# Patient Record
Sex: Female | Born: 1985 | Race: Black or African American | Hispanic: No | Marital: Married | Smoking: Never smoker
Health system: Southern US, Community
[De-identification: ages and names within clinical notes are randomized; demographics above are authoritative.]

---

## 2021-01-08 ENCOUNTER — Encounter (HOSPITAL_COMMUNITY): Payer: Self-pay | Admitting: Emergency Medicine

## 2021-01-08 ENCOUNTER — Emergency Department (HOSPITAL_COMMUNITY): Payer: No Typology Code available for payment source

## 2021-01-08 ENCOUNTER — Emergency Department (HOSPITAL_COMMUNITY)
Admission: EM | Admit: 2021-01-08 | Discharge: 2021-01-08 | Disposition: A | Discharge status: Home / Self Care | Payer: No Typology Code available for payment source | Attending: Emergency Medicine | Admitting: Emergency Medicine

## 2021-01-08 ENCOUNTER — Other Ambulatory Visit: Payer: Self-pay

## 2021-01-08 DIAGNOSIS — Z5321 Procedure and treatment not carried out due to patient leaving prior to being seen by health care provider: Secondary | ICD-10-CM | POA: Diagnosis not present

## 2021-01-08 DIAGNOSIS — Y92410 Unspecified street and highway as the place of occurrence of the external cause: Secondary | ICD-10-CM | POA: Diagnosis not present

## 2021-01-08 DIAGNOSIS — R519 Headache, unspecified: Secondary | ICD-10-CM | POA: Insufficient documentation

## 2021-01-08 DIAGNOSIS — M549 Dorsalgia, unspecified: Secondary | ICD-10-CM | POA: Diagnosis not present

## 2021-01-08 DIAGNOSIS — M25512 Pain in left shoulder: Secondary | ICD-10-CM | POA: Insufficient documentation

## 2021-01-08 LAB — I-STAT BETA HCG BLOOD, ED (MC, WL, AP ONLY): I-stat hCG, quantitative: <5 m[IU]/mL (ref <5)
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
## 2021-01-08 NOTE — ED Notes (Signed)
Pt notified staff that she is leaving due to not wanting to wait
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
